# Patient Record
Sex: Male | Born: 1980 | Race: Black or African American | Hispanic: No | Marital: Single | Smoking: Current some day smoker
Health system: Southern US, Community
[De-identification: ages and names within clinical notes are randomized; demographics above are authoritative.]

## PROBLEM LIST (undated history)

## (undated) DIAGNOSIS — A630 Anogenital (venereal) warts: Secondary | ICD-10-CM

---

## 2005-05-01 ENCOUNTER — Emergency Department (HOSPITAL_COMMUNITY): Admission: EM | Admit: 2005-05-01 | Discharge: 2005-05-01 | Payer: Self-pay | Admitting: Emergency Medicine

## 2005-05-10 ENCOUNTER — Encounter: Admission: RE | Admit: 2005-05-10 | Discharge: 2005-07-08 | Payer: Self-pay | Admitting: Family Medicine

## 2005-05-13 ENCOUNTER — Encounter: Admission: RE | Admit: 2005-05-13 | Discharge: 2005-05-13 | Payer: Self-pay | Admitting: Family Medicine

## 2011-01-22 ENCOUNTER — Emergency Department (HOSPITAL_BASED_OUTPATIENT_CLINIC_OR_DEPARTMENT_OTHER)
Admission: EM | Admit: 2011-01-22 | Discharge: 2011-01-23 | Disposition: A | Discharge status: Home / Self Care | Payer: Self-pay | Attending: Emergency Medicine | Admitting: Emergency Medicine

## 2011-01-22 DIAGNOSIS — R109 Unspecified abdominal pain: Secondary | ICD-10-CM

## 2011-01-22 DIAGNOSIS — R042 Hemoptysis: Secondary | ICD-10-CM | POA: Insufficient documentation

## 2011-01-22 HISTORY — DX: Anogenital (venereal) warts: A63.0

## 2011-01-22 NOTE — ED Notes (Addendum)
Pt states that he has had a sensation of "a beehive inside my body" since March or April.  Pt states that he also has a "funny feeling in my butt like hemorrhoids maybe".  Pt states that he has been coughing up blood for about a month intermittently.

## 2011-01-23 ENCOUNTER — Emergency Department (INDEPENDENT_AMBULATORY_CARE_PROVIDER_SITE_OTHER): Payer: Self-pay

## 2011-01-23 ENCOUNTER — Encounter (HOSPITAL_BASED_OUTPATIENT_CLINIC_OR_DEPARTMENT_OTHER): Payer: Self-pay | Admitting: Emergency Medicine

## 2011-01-23 DIAGNOSIS — R05 Cough: Secondary | ICD-10-CM

## 2011-01-23 DIAGNOSIS — R634 Abnormal weight loss: Secondary | ICD-10-CM

## 2011-01-23 DIAGNOSIS — R110 Nausea: Secondary | ICD-10-CM

## 2011-01-23 DIAGNOSIS — R109 Unspecified abdominal pain: Secondary | ICD-10-CM

## 2011-01-23 DIAGNOSIS — E785 Hyperlipidemia, unspecified: Secondary | ICD-10-CM

## 2011-01-23 DIAGNOSIS — I700 Atherosclerosis of aorta: Secondary | ICD-10-CM

## 2011-01-23 DIAGNOSIS — R059 Cough, unspecified: Secondary | ICD-10-CM

## 2011-01-23 LAB — OCCULT BLOOD X 1 CARD TO LAB, STOOL: Fecal Occult Bld: POSITIVE

## 2011-01-23 LAB — COMPREHENSIVE METABOLIC PANEL
ALT: 10 U/L (ref 0–53)
AST: 16 U/L (ref 0–37)
Albumin: 4.3 g/dL (ref 3.5–5.2)
Alkaline Phosphatase: 72 U/L (ref 39–117)
CO2: 27 mEq/L (ref 19–32)
Calcium: 9.6 mg/dL (ref 8.4–10.5)
Chloride: 102 mEq/L (ref 96–112)
Creatinine, Ser: 1.1 mg/dL (ref 0.50–1.35)
GFR calc Af Amer: >60 mL/min (ref >60)
GFR calc non Af Amer: >60 mL/min (ref >60)
Glucose, Bld: 107 mg/dL — ABNORMAL HIGH (ref 70–99)
Potassium: 3.6 mEq/L (ref 3.5–5.1)
Sodium: 141 mEq/L (ref 135–145)
Total Bilirubin: 0.3 mg/dL (ref 0.3–1.2)

## 2011-01-23 LAB — URINALYSIS, ROUTINE W REFLEX MICROSCOPIC
Bilirubin Urine: NEGATIVE
Glucose, UA: NEGATIVE mg/dL
Hgb urine dipstick: NEGATIVE
Ketones, ur: NEGATIVE mg/dL
Leukocytes, UA: NEGATIVE
Nitrite: NEGATIVE
Protein, ur: NEGATIVE mg/dL
Specific Gravity, Urine: 1.018 (ref 1.005–1.030)
Urobilinogen, UA: 1 mg/dL (ref 0.0–1.0)
pH: 5.5 (ref 5.0–8.0)

## 2011-01-23 LAB — CBC
HCT: 39.4 % (ref 39.0–52.0)
Hemoglobin: 14.2 g/dL (ref 13.0–17.0)
MCH: 29.9 pg (ref 26.0–34.0)
MCHC: 36 g/dL (ref 30.0–36.0)
MCV: 82.9 fL (ref 78.0–100.0)
Platelets: 200 10*3/uL (ref 150–400)
RBC: 4.75 MIL/uL (ref 4.22–5.81)
RDW: 12.4 % (ref 11.5–15.5)
WBC: 8.1 10*3/uL (ref 4.0–10.5)

## 2011-01-23 LAB — LIPASE, BLOOD: Lipase: 154 U/L — ABNORMAL HIGH (ref 11–59)

## 2011-01-23 LAB — PROTIME-INR
INR: 1 (ref 0.00–1.49)
Prothrombin Time: 13.4 seconds (ref 11.6–15.2)

## 2011-01-23 LAB — COMPREHENSIVE METABOLIC PANEL WITH GFR
BUN: 14 mg/dL (ref 6–23)
Total Protein: 7 g/dL (ref 6.0–8.3)

## 2011-01-23 LAB — APTT: aPTT: 36 seconds (ref 24–37)

## 2011-01-23 MED ORDER — IOHEXOL 300 MG/ML  SOLN
100.0000 mL | Freq: Once | INTRAMUSCULAR | Status: AC | PRN
  Administered 2011-01-23: 100 mL via INTRAVENOUS

## 2011-01-23 MED ORDER — SODIUM CHLORIDE 0.9 % IV SOLN
INTRAVENOUS | Status: DC
  Administered 2011-01-23: 01:00:00 via INTRAVENOUS

## 2011-01-23 MED ORDER — SODIUM CHLORIDE 0.9 % IV SOLN: Freq: Once | INTRAVENOUS | Status: DC

## 2011-01-23 MED ORDER — PANTOPRAZOLE SODIUM 20 MG PO TBEC: 40.0000 mg | DELAYED_RELEASE_TABLET | Freq: Every day | ORAL | Status: DC

## 2011-01-23 NOTE — Discharge Instructions (Signed)
 Abdominal Pain Abdominal pain can be caused by many things. Your caregiver decides the seriousness of your pain by an examination and possibly blood tests and X-rays. Many cases can be observed and treated at home. Most abdominal pain is not caused by a disease and will probably improve without treatment. However, in many cases, more time must pass before a clear cause of the pain can be found. Before that point, it may not be known if you need more testing, or if hospitalization or surgery is needed. HOME CARE INSTRUCTIONS  Do not take laxatives unless directed by your caregiver.   Take pain medicine only as directed by your caregiver.   Only take over-the-counter or prescription medicines for pain, discomfort, or fever as directed by your caregiver.    SEEK IMMEDIATE MEDICAL CARE IF:  The pain does not go away.   You or your child has an oral temperature above 101, not controlled by medicine.   You keep throwing up (vomiting).   The pain is felt only in portions of the abdomen. Pain in the right side could possibly be appendicitis. In an adult, pain in the left lower portion of the abdomen could be colitis or diverticulitis.   You pass bloody or black tarry stools.  MAKE SURE YOU:  Understand these instructions.   Will watch your condition.   Will get help right away if you are not doing well or get worse.  Document Released: 03/30/2005 Document Re-Released: 09/14/2009 ExitCare Patient Information 2011 [place], [place].  ED's provide medical screening exams and initial stabilizing treatment of emergency medical conditions.  Medicine is an Pharmacologist and many conditions cannot be diagnosed or completely treated during a single ED visit.  Your treating healthcare provider(s) today feel your condition has been stabilized so further care as an outpatient is reasonable.  Emergency care does not substitute for complete, ongoing, or follow-up care by your primary care physician or  consultant.    Your medication list was reviewed prior to treatment, and at discharge, by the treating provider for the purpose of this outpatient visit only.  Please review this entire medication list with your pharmacist, primary care physician, and specialist(s).  It is your responsibility to share any new medication instructions you received this visit with your doctor(s).  Although no medicine is without risk, your healthcare provider today feels reasonable decisions were made concerning starting new medications and stopping or changing the dosages of your usual medications until you receive follow-up care.  Take medications only as directed.  Many medications can cause drowsiness, especially those for pain, anxiety, muscle spasms, nausea, and allergies.  DO NOT drive, drink alcohol, operate power machinery, or participate in potentially dangerous activities if taking medicines that make you tired.  Chronic pain is best managed by pain specialists or primary care physicians, so narcotic refills are not routinely dispensed in the ED.  DO NOT take multiple medications containing acetaminophen (Tylenol), such as many narcotic drug combinations and over-the-counter cold medicines.

## 2011-01-23 NOTE — ED Provider Notes (Signed)
History     Chief Complaint  Patient presents with  . Abdominal Pain    "feels like a beehive is inside my body"  . Hemoptysis   HPI Comments: Pt reports 4 months of intermittent sharp pains on his "insides" that are intermittent and affects his anus and rectum.  He reports having chills, fevers and seeing blood once about 4 months ago when this first began.  He stopped smoking marijuana due to the way he was feeling, hasn't helped.  Has no insurance and no PCP so has not seen anyone.  Also about 1 month ago only with brushing his teeth, he sees blood that he thinks he coughs up, but has no coughing when not brushing teeth and no N/V when not brushing teeth.  Denies gum bleeding, can't tell where it is coming from.  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  No night sweats, HA's, seeing things, focal weakness.  He does smoke cigars and cigarettes, drinks rarely.  He does have some depression at times, but has so chronically and is not concerned about it.  Stems he states from life choices and not having a good job or relationships at this time.    Patient is a 30 y.o. male presenting with abdominal pain. The history is provided by the patient.  Abdominal Pain The primary symptoms of the illness include abdominal pain. The primary symptoms of the illness do not include fever, nausea, vomiting or diarrhea.    Past Medical History  Diagnosis Date  . Genital warts     History reviewed. No pertinent past surgical history.  History reviewed. No pertinent family history.  History  Substance Use Topics  . Smoking status: Current Some Day Smoker    Types: Cigars  . Smokeless tobacco: Never Used  . Alcohol Use: Yes     occasional drinker      Review of Systems  Constitutional: Negative for fever.  Gastrointestinal: Positive for abdominal pain. Negative for nausea, vomiting and diarrhea.  All other systems reviewed and are negative.    Physical Exam  BP  155/87  Pulse 70  Temp(Src) 98.8 F (37.1 C) (Oral)  Resp 17  Ht 5\' 11"  (1.803 m)  Wt 218 lb (98.884 kg)  BMI 30.40 kg/m2  SpO2 100%  Physical Exam  Constitutional: He is oriented to person, place, and time. He appears well-developed and well-nourished. No distress.  HENT:  Head: Normocephalic and atraumatic.  Mouth/Throat: Oropharynx is clear and moist.  Eyes: Conjunctivae are normal. Pupils are equal, round, and reactive to light.  Neck: Normal range of motion. Neck supple. No thyromegaly present.  Cardiovascular: Normal rate and regular rhythm.   Pulmonary/Chest: Effort normal and breath sounds normal.  Abdominal: Soft. Bowel sounds are normal. He exhibits no distension and no mass. There is no tenderness. There is no rebound and no guarding.  Genitourinary: Rectum normal.  Musculoskeletal: Normal range of motion.  Neurological: He is alert and oriented to person, place, and time.  Skin: Skin is warm and dry. No rash noted. He is not diaphoretic.  Psychiatric: He has a normal mood and affect. His speech is normal and behavior is normal. He is not agitated, not aggressive, is not hyperactive, not slowed, not withdrawn, not actively hallucinating and not combative. Cognition and memory are not impaired. He does not express impulsivity or inappropriate judgment.    ED Course  Procedures  MDM No distress, no alarms for cancer at  this time.  I stressed importance of PCP care and that with 4 months of symptoms, need for ongoing primary care.  Some concern for possible delusion, but he denies hallucinations, and there is no family h/o psych problems.  He admits to some depression, but no recent inciting incident to acutely worsen symptoms.  We agreed to perform some screening with with labs tests, xrays and DRE was performed with chaperone present, was otherwise normal.  No oral lesions or areas of bleeding noted on exam.  If labs ok, pt is screened, will put on PPI and advise follow up  with a PCP.  Told about health department and [facility].      2:14 AM Pt's plain films reviewed by me.  Lipase is up, pt denies drinking much alcohol , and slight heme positive stool, CT of abd/pelvis pending.    3:01 AM CT scan is neg per radiologist.  No tumor, no pancreatic stranding to confirm elevated lipase test.  Will d/c home  Gavin Pound. Tevis Conger, MD 01/23/11 [id number]

## 2015-03-14 ENCOUNTER — Emergency Department (HOSPITAL_COMMUNITY)
Admission: EM | Admit: 2015-03-14 | Discharge: 2015-03-14 | Disposition: A | Discharge status: Home / Self Care | Payer: No Typology Code available for payment source | Attending: Emergency Medicine | Admitting: Emergency Medicine

## 2015-03-14 ENCOUNTER — Emergency Department (HOSPITAL_COMMUNITY): Payer: No Typology Code available for payment source

## 2015-03-14 ENCOUNTER — Encounter (HOSPITAL_COMMUNITY): Payer: Self-pay | Admitting: *Deleted

## 2015-03-14 DIAGNOSIS — Y92410 Unspecified street and highway as the place of occurrence of the external cause: Secondary | ICD-10-CM | POA: Diagnosis not present

## 2015-03-14 DIAGNOSIS — Z720 Tobacco use: Secondary | ICD-10-CM | POA: Insufficient documentation

## 2015-03-14 DIAGNOSIS — S161XXA Strain of muscle, fascia and tendon at neck level, initial encounter: Secondary | ICD-10-CM | POA: Insufficient documentation

## 2015-03-14 DIAGNOSIS — Z79899 Other long term (current) drug therapy: Secondary | ICD-10-CM | POA: Insufficient documentation

## 2015-03-14 DIAGNOSIS — S199XXA Unspecified injury of neck, initial encounter: Secondary | ICD-10-CM | POA: Diagnosis present

## 2015-03-14 DIAGNOSIS — Y998 Other external cause status: Secondary | ICD-10-CM | POA: Insufficient documentation

## 2015-03-14 DIAGNOSIS — S39012A Strain of muscle, fascia and tendon of lower back, initial encounter: Secondary | ICD-10-CM | POA: Insufficient documentation

## 2015-03-14 DIAGNOSIS — Z8619 Personal history of other infectious and parasitic diseases: Secondary | ICD-10-CM | POA: Diagnosis not present

## 2015-03-14 DIAGNOSIS — Y9389 Activity, other specified: Secondary | ICD-10-CM | POA: Diagnosis not present

## 2015-03-14 MED ORDER — NAPROXEN 375 MG PO TABS: 375.0000 mg | ORAL_TABLET | Freq: Two times a day (BID) | ORAL | Status: DC

## 2015-03-14 MED ORDER — CYCLOBENZAPRINE HCL 10 MG PO TABS: 10.0000 mg | ORAL_TABLET | Freq: Two times a day (BID) | ORAL | Status: DC | PRN

## 2015-03-14 MED ORDER — TRAMADOL HCL 50 MG PO TABS: 50.0000 mg | ORAL_TABLET | Freq: Four times a day (QID) | ORAL | Status: DC | PRN
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
## 2018-03-10 ENCOUNTER — Encounter: Payer: Self-pay | Admitting: Urgent Care

## 2018-03-10 ENCOUNTER — Ambulatory Visit: Payer: Managed Care, Other (non HMO) | Admitting: Urgent Care

## 2018-03-10 ENCOUNTER — Other Ambulatory Visit: Payer: Self-pay

## 2018-03-10 VITALS — BP 140/90 | HR 63 | Temp 98.6°F | Resp 16 | Ht 71.85 in | Wt 256.2 lb

## 2018-03-10 DIAGNOSIS — K921 Melena: Secondary | ICD-10-CM | POA: Diagnosis not present

## 2018-03-10 DIAGNOSIS — B078 Other viral warts: Secondary | ICD-10-CM

## 2018-03-10 NOTE — Progress Notes (Signed)
    MRN: [id number] DOB: 11/28/1980  Subjective:   Alexander Chaney is a 37 y.o. male presenting for several year history of intermittent bloody stools. Has never been worked up for this. Denies history of hemorrhoids, history of colon cancer (personal or family). Has occasional tingling, feels "off" in his abdomen, last episode was months ago.  [facility] has a current medication list which includes the following prescription(s): cyclobenzaprine, naproxen, pantoprazole, and tramadol. Also has No Known Allergies.  [place]  has a past medical history of Genital warts. Denies past surgical history.   Objective:   Vitals: BP 140/90   Pulse 63   Temp 98.6 F (37 C) (Oral)   Resp 16   Ht 5' 11.85" (1.825 m)   Wt 256 lb 3.2 oz (116.2 kg)   SpO2 98%   BMI 34.89 kg/m   BP Readings from Last 3 Encounters:  03/10/18 140/90  03/14/15 157/80  01/23/11 133/71    Physical Exam  Constitutional: He is oriented to person, place, and time. He appears well-developed and well-nourished.  Cardiovascular: Normal rate.  Pulmonary/Chest: Effort normal.  Neurological: He is alert and oriented to person, place, and time.  Skin:      Assessment and Plan :   Bloody stools - Plan: Ambulatory referral to Gastroenterology  Common wart - Plan: Ambulatory referral to Dermatology  Referral to dermatology for management of multiple large warts pending.  Referral to gastroenterology for work-up of bloody stools pending.  Patient is to monitor blood pressure, follow-up with PCP if this persists.  Wallis Bamberg, PA-C Primary Care at [facility] [phone] 03/10/2018  11:31 AM

## 2018-03-10 NOTE — Patient Instructions (Addendum)
Warts Warts are small growths on the skin. They are common and can occur on various areas of the body. A person may have one wart or multiple warts. Most warts are not painful, and they usually do not cause problems. However, warts can cause pain if they are large or occur in an area of the body where pressure will be applied to them, such as the bottom of the foot. In many cases, warts do not require treatment. They usually go away on their own over a period of many months to a couple years. Various treatments may be done for warts that cause problems or do not go away. Sometimes, warts go away and then come back again. What are the causes? Warts are caused by a type of virus that is called human papillomavirus (HPV). This virus can spread from person to person through direct contact. Warts can also spread to other areas of the body when a person scratches a wart and then scratches another area of his or her body. What increases the risk? Warts are more likely to develop in:  People who are 44-52 years of age.  People who have a weakened body defense system (immune system).  What are the signs or symptoms? A wart may be round or oval or have an irregular shape. Most warts have a rough surface. Warts may range in color from skin color to light yellow, brown, or gray. They are generally less than  inch (1.3 cm) in size. Most warts are painless, but some can be painful when pressure is applied to them. How is this diagnosed? A wart can usually be diagnosed from its appearance. In some cases, a tissue sample may be removed (biopsy) to be looked at under a microscope. How is this treated? In many cases, warts do not need treatment. If treatment is needed, options may include:  Applying medicated solutions, creams, or patches to the wart. These may be over-the-counter or prescription medicines that make the skin soft so that layers will gradually shed away. In many cases, the medicine is applied one  or two times per day and covered with a bandage.  Putting duct tape over the top of the wart (occlusion). You will leave the tape in place for as long as told by your health care provider, then you will replace it with a new strip of tape. This is done until the wart goes away.  Freezing the wart with liquid nitrogen (cryotherapy).  Burning the wart with: ? Laser treatment. ? An electrified probe (electrocautery).  Injection of a medicine (Candida antigen) into the wart to help the body's immune system to fight off the wart.  Surgery to remove the wart.  Follow these instructions at home:  Apply over-the-counter and prescription medicines only as told by your health care provider.  Do not apply over-the-counter wart medicines to your face or genitals before you ask your health care provider if it is okay to do so.  Do not scratch or pick at a wart.  Wash your hands after you touch a wart.  Avoid shaving hair that is over a wart.  Keep all follow-up visits as told by your health care provider. This is important. Contact a health care provider if:  Your warts do not improve after treatment.  You have redness, swelling, or pain at the site of a wart.  You have bleeding from a wart that does not stop with light pressure.  You have diabetes and you develop a  wart. This information is not intended to replace advice given to you by your health care provider. Make sure you discuss any questions you have with your health care provider. Document Released: 03/30/2005 Document Revised: 12/02/2015 Document Reviewed: 09/15/2014 Elsevier Interactive Patient Education  [employer].    If you have lab work done today you will be contacted with your lab results within the next 2 weeks.  If you have not heard from [place] then please contact us. The fastest way to get your results is to register for My Chart.   IF you received an x-ray today, you will receive an invoice from [facility]  Radiology. Please contact [facility] Radiology at [phone] with questions or concerns regarding your invoice.   IF you received labwork today, you will receive an invoice from [facility]. Please contact [facility] at [phone] with questions or concerns regarding your invoice.   Our billing staff will not be able to assist you with questions regarding bills from these companies.  You will be contacted with the lab results as soon as they are available. The fastest way to get your results is to activate your My Chart account. Instructions are located on the last page of this paperwork. If you have not heard from [place] regarding the results in 2 weeks, please contact this office.

## 2018-03-14 ENCOUNTER — Encounter: Payer: Self-pay | Admitting: Gastroenterology

## 2018-04-27 ENCOUNTER — Ambulatory Visit: Payer: Self-pay | Admitting: Gastroenterology

## 2018-05-17 ENCOUNTER — Ambulatory Visit: Payer: Self-pay | Admitting: Gastroenterology

## 2018-05-17 NOTE — Progress Notes (Deleted)
Referring Provider: Wallis Bamberg, PA-C Primary Care Physician:  No primary care provider on file.   Reason for Consultation: Blood in the stool   IMPRESSION:  Blood in the stool  PLAN: Colonoscopy  I consented the patient at the bedside today discussing the risks, benefits, and alternatives to endoscopic evaluation. In particular, we discussed the risks that include, but are not limited to, reaction to medication, cardiopulmonary compromise, bleeding requiring blood transfusion, aspiration resulting in pneumonia, perforation requiring surgery, and even death. We reviewed the risk of missed lesion including polyps or even cancer. The patient acknowledges these risks and asks that we proceed.   HPI: Alexander Chaney is a 37 y.o. male seen in consultation at the request of PA Urban Gibson for further evaluation of blood in the stool.  The history is obtained through the patient and review of his electronic health record.  He has several years of intermittent bloody stools.  There is no known history of hemorrhoids.  Fecal occult blood test was positive in 2012.  No recent CBC or blood tests.  There is no known family history of colon cancer or polyps.  Review of EPIC shows no abnormalities on a CT of the abdomen and pelvis with contrast 01/23/2011.   Past Medical History:  Diagnosis Date  . Genital warts     No past surgical history on file.  No current outpatient medications on file.   No current facility-administered medications for this visit.     Allergies as of 05/17/2018  . (No Known Allergies)    No family history on file.  Social History   Socioeconomic History  . Marital status: Single    Spouse name: Not on file  . Number of children: Not on file  . Years of education: Not on file  . Highest education level: Not on file  Occupational History  . Not on file  Social Needs  . Financial resource strain: Not on file  . Food insecurity:    Worry: Not on file   Inability: Not on file  . Transportation needs:    Medical: Not on file    Non-medical: Not on file  Tobacco Use  . Smoking status: Current Some Day Smoker    Packs/day: 1.00    Types: Cigars  . Smokeless tobacco: Never Used  Substance and Sexual Activity  . Alcohol use: Yes    Alcohol/week: 3.0 standard drinks    Types: 3 Cans of beer per week    Comment: occasional drinker  . Drug use: No    Types: Marijuana    Comment: quit 4 months ago when symptoms began  . Sexual activity: Yes  Lifestyle  . Physical activity:    Days per week: Not on file    Minutes per session: Not on file  . Stress: Not on file  Relationships  . Social connections:    Talks on phone: Not on file    Gets together: Not on file    Attends religious service: Not on file    Active member of club or organization: Not on file    Attends meetings of clubs or organizations: Not on file    Relationship status: Not on file  . Intimate partner violence:    Fear of current or ex partner: Not on file    Emotionally abused: Not on file    Physically abused: Not on file    Forced sexual activity: Not on file  Other Topics Concern  . Not  on file  Social History Narrative  . Not on file    Review of Systems: 12 system ROS is negative except as noted above.  There were no vitals filed for this visit.  Physical Exam: Vital signs were reviewed. General:   Alert, well-nourished, pleasant and cooperative in NAD Head:  Normocephalic and atraumatic. Eyes:  Sclera clear, no icterus.   Conjunctiva pink. Mouth:  No deformity or lesions.   Neck:  Supple; no thyromegaly. Lungs:  Clear throughout to auscultation.   No wheezes.  Heart:  Regular rate and rhythm; no murmurs Abdomen:  Soft, nontender, normal bowel sounds. No rebound or guarding. No hepatosplenomegaly Rectal:  Deferred  Msk:  Symmetrical without gross deformities. Extremities:  No gross deformities or edema. Neurologic:  Alert and  oriented x4;  grossly  nonfocal Skin:  No rash or bruise. Psych:  Alert and cooperative. Normal mood and affect.   Rozina Pointer L. Orvan FalconerBeavers, Md, MPH [facility] 05/17/2018, 8:25 AM

## 2018-10-01 ENCOUNTER — Telehealth: Payer: Managed Care, Other (non HMO) | Admitting: Family Medicine

## 2018-10-01 ENCOUNTER — Other Ambulatory Visit: Payer: Self-pay

## 2018-10-01 ENCOUNTER — Telehealth: Payer: Self-pay | Admitting: Family Medicine

## 2018-10-01 NOTE — Telephone Encounter (Signed)
Copied from CRM [id number]. Topic: Appointment Scheduling - Scheduling Inquiry for Clinic >> Oct 01, 2018  4:29 PM Dalphine Handing A wrote: Reason for CRM: Patient wife called in and stated that they never received the phone call for virtual visit. Patient wife stated that they missed a call from unknown number around 3pm but did not think it was the office calling. Please advise

## 2018-10-02 ENCOUNTER — Other Ambulatory Visit: Payer: Self-pay

## 2018-10-02 ENCOUNTER — Ambulatory Visit (INDEPENDENT_AMBULATORY_CARE_PROVIDER_SITE_OTHER): Payer: Managed Care, Other (non HMO) | Admitting: Family Medicine

## 2018-10-02 ENCOUNTER — Encounter: Payer: Self-pay | Admitting: Family Medicine

## 2018-10-02 DIAGNOSIS — J302 Other seasonal allergic rhinitis: Secondary | ICD-10-CM

## 2018-10-02 MED ORDER — FLUTICASONE PROPIONATE 50 MCG/ACT NA SUSP: 1.0000 | Freq: Two times a day (BID) | NASAL | 6 refills | Status: AC

## 2018-10-02 MED ORDER — CETIRIZINE HCL 10 MG PO TABS: 10.0000 mg | ORAL_TABLET | Freq: Every day | ORAL | 11 refills | Status: AC

## 2018-10-02 NOTE — Progress Notes (Signed)
Works in [employer], heavy smoker, has been having dry cough and some fever last wk and some congestion. Supervisor thinks he should be cleared before he come bk to work. Inquired about the c-virus testing based on his symptoms. Also says he did not quarantine himself away from his family.

## 2018-10-02 NOTE — Progress Notes (Signed)
Virtual Visit via telephone Note  I connected with patient on 10/02/18 at 258pm by telephone and verified that I am speaking with the correct person using two identifiers. Alexander Chaney is currently located at home and patient is currently with her during visit. The provider, Myles Lipps, MD is located in their office at time of visit.  I discussed the limitations, risks, security and privacy concerns of performing an evaluation and management service by telephone and the availability of in person appointments. I also discussed with the patient that there may be a patient responsible charge related to this service. The patient expressed understanding and agreed to proceed.  No chief complaint on file.   Telephone visit today requesting letter for return to work  HPI On march 18th started having lots of sneezing and itchy watery eyes Missed the following day of work Smokes a 1ppd Has occasional mild dry cough Was having mild chest pain, which resolved, and he has had before No SOB Did not go to work at all week of March 23th as he felt he might have had a mild fever for about week He was supposed to come back to work this Monday but needs note Nobody else got sick He if feeling well as long as he does not go outside, then all sx return Works in [employer], about 30-40 people on site, in [place] Has known seasonal allergies - not taking any medications ?  Fall Risk  03/10/2018  Falls in the past year? No     Depression screen PHQ 2/9 03/10/2018  Decreased Interest 0  Down, Depressed, Hopeless 0  PHQ - 2 Score 0    No Known Allergies  Prior to Admission medications   Medication Sig Start Date End Date Taking? Authorizing Provider  cetirizine (ZYRTEC) 10 MG tablet Take 1 tablet (10 mg total) by mouth daily. 10/02/18   Myles Lipps, MD  fluticasone (FLONASE) 50 MCG/ACT nasal spray Place 1 spray into both nostrils 2 (two) times daily. 10/02/18   Myles Lipps, MD     Past Medical History:  Diagnosis Date  . Genital warts     No past surgical history on file.  Social History   Tobacco Use  . Smoking status: Current Some Day Smoker    Packs/day: 1.00    Types: Cigars  . Smokeless tobacco: Never Used  Substance Use Topics  . Alcohol use: Yes    Alcohol/week: 3.0 standard drinks    Types: 3 Cans of beer per week    Comment: occasional drinker    No family history on file.  ROS  Per hpi  Objective  Vitals as reported by the patient: none  There were no vitals filed for this visit.  ASSESSMENT and PLAN  1. Seasonal allergies Discussed supportive measures, new meds r/se/b and RTC precautions. Letter to return back to work given. Other orders - cetirizine (ZYRTEC) 10 MG tablet; Take 1 tablet (10 mg total) by mouth daily. - fluticasone (FLONASE) 50 MCG/ACT nasal spray; Place 1 spray into both nostrils 2 (two) times daily.  FOLLOW-UP: prn   The above assessment and management plan was discussed with the patient. The patient verbalized understanding of and has agreed to the management plan. Patient is aware to call the clinic if symptoms persist or worsen. Patient is aware when to return to the clinic for a follow-up visit. Patient educated on when it is appropriate to go to the emergency department.    I  provided 13 minutes of non-face-to-face time during this encounter.  Myles Lipps, MD Primary Care at [facility] [address] [place], [place] [address] Ph.  [phone] Fax [phone]

## 2018-10-03 NOTE — Telephone Encounter (Signed)
Needs to schedule new tele med.
